# Patient Record
Sex: Male | Born: 1975 | Race: White | Hispanic: No | Marital: Single | Smoking: Never smoker
Health system: Southern US, Community
[De-identification: ages and names within clinical notes are randomized; demographics above are authoritative.]

---

## 2014-01-04 ENCOUNTER — Encounter (HOSPITAL_COMMUNITY): Payer: Self-pay | Admitting: Emergency Medicine

## 2014-01-04 ENCOUNTER — Emergency Department (HOSPITAL_COMMUNITY): Payer: Self-pay

## 2014-01-04 ENCOUNTER — Emergency Department (HOSPITAL_COMMUNITY)
Admission: EM | Admit: 2014-01-04 | Discharge: 2014-01-04 | Disposition: A | Discharge status: Home / Self Care | Payer: Self-pay | Attending: Emergency Medicine | Admitting: Emergency Medicine

## 2014-01-04 DIAGNOSIS — S71132A Puncture wound without foreign body, left thigh, initial encounter: Secondary | ICD-10-CM

## 2014-01-04 DIAGNOSIS — W3409XA Accidental discharge from other specified firearms, initial encounter: Secondary | ICD-10-CM | POA: Insufficient documentation

## 2014-01-04 DIAGNOSIS — Y9389 Activity, other specified: Secondary | ICD-10-CM | POA: Insufficient documentation

## 2014-01-04 DIAGNOSIS — W3400XA Accidental discharge from unspecified firearms or gun, initial encounter: Secondary | ICD-10-CM

## 2014-01-04 DIAGNOSIS — Y9289 Other specified places as the place of occurrence of the external cause: Secondary | ICD-10-CM | POA: Insufficient documentation

## 2014-01-04 DIAGNOSIS — S71102A Unspecified open wound, left thigh, initial encounter: Secondary | ICD-10-CM | POA: Insufficient documentation

## 2014-01-04 LAB — COMPREHENSIVE METABOLIC PANEL
ALBUMIN: 4.1 g/dL (ref 3.5–5.2)
ALT: 28 U/L (ref 0–53)
AST: 34 U/L (ref 0–37)
Alkaline Phosphatase: 79 U/L (ref 39–117)
Anion gap: 14 (ref 5–15)
BILIRUBIN TOTAL: 0.2 mg/dL — AB (ref 0.3–1.2)
BUN: 15 mg/dL (ref 6–23)
CHLORIDE: 105 meq/L (ref 96–112)
CO2: 26 mEq/L (ref 19–32)
CREATININE: 1.07 mg/dL (ref 0.50–1.35)
Calcium: 8.8 mg/dL (ref 8.4–10.5)
GFR calc Af Amer: >90 mL/min (ref >90)
GFR calc non Af Amer: 86 mL/min — ABNORMAL LOW (ref >90)
Glucose, Bld: 108 mg/dL — ABNORMAL HIGH (ref 70–99)
POTASSIUM: 3.8 meq/L (ref 3.7–5.3)
Sodium: 145 mEq/L (ref 137–147)
TOTAL PROTEIN: 7.7 g/dL (ref 6.0–8.3)

## 2014-01-04 LAB — PROTIME-INR
INR: 1.11 (ref 0.00–1.49)
PROTHROMBIN TIME: 14.5 s (ref 11.6–15.2)

## 2014-01-04 LAB — PREPARE FRESH FROZEN PLASMA
Unit division: 0
Unit division: 0

## 2014-01-04 LAB — CBC
HEMATOCRIT: 42.4 % (ref 39.0–52.0)
HEMOGLOBIN: 15.1 g/dL (ref 13.0–17.0)
MCH: 31.3 pg (ref 26.0–34.0)
MCHC: 35.6 g/dL (ref 30.0–36.0)
MCV: 88 fL (ref 78.0–100.0)
Platelets: 303 10*3/uL (ref 150–400)
RBC: 4.82 MIL/uL (ref 4.22–5.81)
RDW: 13 % (ref 11.5–15.5)
WBC: 6.2 10*3/uL (ref 4.0–10.5)

## 2014-01-04 LAB — ETHANOL: Alcohol, Ethyl (B): 273 mg/dL — ABNORMAL HIGH (ref 0–11)

## 2014-01-04 LAB — SAMPLE TO BLOOD BANK

## 2014-01-04 LAB — CDS SEROLOGY

## 2014-01-04 MED ORDER — ACETAMINOPHEN 325 MG PO TABS
650.0000 mg | ORAL_TABLET | Freq: Once | ORAL | Status: AC
  Administered 2014-01-04: 650 mg via ORAL
  Filled 2014-01-04: qty 2

## 2014-01-04 MED ORDER — LIDOCAINE HCL (PF) 1 % IJ SOLN
5.0000 mL | Freq: Once | INTRAMUSCULAR | Status: AC
  Administered 2014-01-04: 5 mL via INTRADERMAL
  Filled 2014-01-04: qty 5

## 2014-01-04 NOTE — ED Notes (Signed)
Patient states he does not want any pain medication.
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
## 2014-01-04 NOTE — Discharge Instructions (Signed)
Keep wound clean, shower as normal. Have stitches removed in 7-10 days. See physician for signs of infection such as fevers, spreading redness, pus draining.  If you were given medicines take as directed.  If you are on coumadin or contraceptives realize their levels and effectiveness is altered by many different medicines.  If you have any reaction (rash, tongues swelling, other) to the medicines stop taking and see a physician.   Please follow up as directed and return to the ER or see a physician for new or worsening symptoms.  Thank you. Filed Vitals:   01/04/14 0516 01/04/14 0518 01/04/14 0554 01/04/14 0613  BP: 129/65 123/65    Pulse:   68   Temp:    98.2 F (36.8 C)  TempSrc:    Oral  Resp:   15   SpO2:   97%

## 2014-01-04 NOTE — ED Notes (Signed)
Dr Zavitz at bedside  

## 2014-01-04 NOTE — ED Notes (Signed)
Removed  IV Catheter.
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
## 2018-12-10 ENCOUNTER — Emergency Department (HOSPITAL_COMMUNITY)
Admission: EM | Admit: 2018-12-10 | Discharge: 2018-12-10 | Disposition: A | Discharge status: Home / Self Care | Payer: 59 | Attending: Emergency Medicine | Admitting: Emergency Medicine

## 2018-12-10 ENCOUNTER — Encounter (HOSPITAL_COMMUNITY): Payer: Self-pay | Admitting: Emergency Medicine

## 2018-12-10 ENCOUNTER — Other Ambulatory Visit: Payer: Self-pay

## 2018-12-10 ENCOUNTER — Emergency Department (HOSPITAL_COMMUNITY): Payer: 59

## 2018-12-10 DIAGNOSIS — F17220 Nicotine dependence, chewing tobacco, uncomplicated: Secondary | ICD-10-CM | POA: Insufficient documentation

## 2018-12-10 DIAGNOSIS — U071 COVID-19: Secondary | ICD-10-CM | POA: Insufficient documentation

## 2018-12-10 DIAGNOSIS — Z5329 Procedure and treatment not carried out because of patient's decision for other reasons: Secondary | ICD-10-CM | POA: Insufficient documentation

## 2018-12-10 DIAGNOSIS — R51 Headache: Secondary | ICD-10-CM | POA: Diagnosis present

## 2018-12-10 DIAGNOSIS — R0602 Shortness of breath: Secondary | ICD-10-CM | POA: Insufficient documentation

## 2018-12-10 LAB — CBC WITH DIFFERENTIAL/PLATELET
Abs Immature Granulocytes: 0.04 10*3/uL (ref 0.00–0.07)
Basophils Absolute: 0 10*3/uL (ref 0.0–0.1)
Basophils Relative: 0 %
Eosinophils Absolute: 0 10*3/uL (ref 0.0–0.5)
Eosinophils Relative: 0 %
HCT: 43.8 % (ref 39.0–52.0)
Hemoglobin: 15.1 g/dL (ref 13.0–17.0)
Immature Granulocytes: 1 %
Lymphocytes Relative: 9 %
Lymphs Abs: 0.8 10*3/uL (ref 0.7–4.0)
MCH: 31.3 pg (ref 26.0–34.0)
MCHC: 34.5 g/dL (ref 30.0–36.0)
MCV: 90.7 fL (ref 80.0–100.0)
Monocytes Absolute: 0.2 10*3/uL (ref 0.1–1.0)
Monocytes Relative: 2 %
Neutro Abs: 7.6 10*3/uL (ref 1.7–7.7)
Neutrophils Relative %: 88 %
Platelets: 183 10*3/uL (ref 150–400)
RBC: 4.83 MIL/uL (ref 4.22–5.81)
RDW: 12.2 % (ref 11.5–15.5)
WBC: 8.6 10*3/uL (ref 4.0–10.5)
nRBC: 0 % (ref 0.0–0.2)

## 2018-12-10 LAB — COMPREHENSIVE METABOLIC PANEL
ALT: 35 U/L (ref 0–44)
AST: 36 U/L (ref 15–41)
Albumin: 3.8 g/dL (ref 3.5–5.0)
Alkaline Phosphatase: 59 U/L (ref 38–126)
Anion gap: 11 (ref 5–15)
BUN: 13 mg/dL (ref 6–20)
CO2: 26 mmol/L (ref 22–32)
Calcium: 8.5 mg/dL — ABNORMAL LOW (ref 8.9–10.3)
Chloride: 95 mmol/L — ABNORMAL LOW (ref 98–111)
Creatinine, Ser: 1.23 mg/dL (ref 0.61–1.24)
GFR calc Af Amer: >60 mL/min (ref >60)
GFR calc non Af Amer: >60 mL/min (ref >60)
Glucose, Bld: 122 mg/dL — ABNORMAL HIGH (ref 70–99)
Potassium: 3.8 mmol/L (ref 3.5–5.1)
Sodium: 132 mmol/L — ABNORMAL LOW (ref 135–145)
Total Bilirubin: 0.8 mg/dL (ref 0.3–1.2)
Total Protein: 8.1 g/dL (ref 6.5–8.1)

## 2018-12-10 LAB — SARS CORONAVIRUS 2 BY RT PCR (HOSPITAL ORDER, PERFORMED IN ~~LOC~~ HOSPITAL LAB): SARS Coronavirus 2: POSITIVE — AB

## 2018-12-10 LAB — PROTIME-INR
INR: 1.1 (ref 0.8–1.2)
Prothrombin Time: 14.1 seconds (ref 11.4–15.2)

## 2018-12-10 LAB — LACTIC ACID, PLASMA: Lactic Acid, Venous: 1.1 mmol/L (ref 0.5–1.9)

## 2018-12-10 MED ORDER — PROCHLORPERAZINE EDISYLATE 10 MG/2ML IJ SOLN
10.0000 mg | Freq: Once | INTRAMUSCULAR | Status: AC
  Administered 2018-12-10: 10 mg via INTRAVENOUS
  Filled 2018-12-10: qty 2

## 2018-12-10 MED ORDER — DEXAMETHASONE SODIUM PHOSPHATE 10 MG/ML IJ SOLN
10.0000 mg | Freq: Once | INTRAMUSCULAR | Status: AC
  Administered 2018-12-10: 10 mg via INTRAVENOUS
  Filled 2018-12-10: qty 1

## 2018-12-10 MED ORDER — DIPHENHYDRAMINE HCL 50 MG/ML IJ SOLN
12.5000 mg | Freq: Once | INTRAMUSCULAR | Status: AC
  Administered 2018-12-10: 12.5 mg via INTRAVENOUS
  Filled 2018-12-10: qty 1

## 2018-12-10 MED ORDER — ACETAMINOPHEN 500 MG PO TABS
1000.0000 mg | ORAL_TABLET | Freq: Once | ORAL | Status: AC
  Administered 2018-12-10: 1000 mg via ORAL
  Filled 2018-12-10: qty 2

## 2018-12-10 MED ORDER — SODIUM CHLORIDE 0.9 % IV BOLUS
500.0000 mL | Freq: Once | INTRAVENOUS | Status: AC
  Administered 2018-12-10: 500 mL via INTRAVENOUS

## 2018-12-10 NOTE — ED Triage Notes (Signed)
Pt reports that since Thursday had headache and had fevers. Took ibuprofen around 8am today.

## 2018-12-10 NOTE — Discharge Instructions (Addendum)
You were seen in the emergency department today for headache, fever, and body aches. Your COVID-19 testing was positive.  We recommended you be admitted to the hospital due to your symptoms and your respiratory status.  You are leaving [place].  Please be aware you may return to the emergency department for further care at any time.  Your chest x-ray showed findings consistent with COVID, your heart also look somewhat enlarged, this should be followed up by primary care for repeat x-ray possibly.  Please take Tylenol/Motrin per over-the-counter dosing to help with fever/chills/pain. Please be sure to stay well-hydrated.  Please follow attached guidelines. Be sure to wash your hands and quarantine.   Follow-up with primary care within 3 days for reevaluation, please call him to make you aware of your symptoms so that they can maintain safety given your diagnosis of COVID.  If you do not primary care please call the phone number circled in your discharge instructions.  You will need to quarantine for 14 days. Return to the emergency department again anytime should you choose to, please return immediately if you experience worsening trouble breathing, worsening headache, change in quality of headache, passing out, inability to keep fluids down, or any other concerns.

## 2018-12-10 NOTE — ED Notes (Signed)
PT ambulated around room. Pulse ox  90-93% on room air. Pt expressed shob was increased from resting position.

## 2018-12-10 NOTE — ED Notes (Addendum)
CRITICAL VALUE STICKER  CRITICAL VALUE: COVID POSITIVE  DATE & TIME NOTIFIED: 12/10/18 1802  MD NOTIFIED: Roslynn Amble MD  TIME OF NOTIFICATION: [phone]

## 2018-12-10 NOTE — ED Provider Notes (Signed)
[facility]-EMERGENCY DEPT Provider Note   CSN: [id number] Arrival date & time: 12/10/18  1514     History   Chief Complaint Chief Complaint  Patient presents with   Headache   Fever    HPI Charles Roach is a 43 y.o. male without significant past medical history who presents to the emergency department with complaints of headache and fever for the past 4 days.  Patient states headache is located to the left side of his head, and has had gradual onset with steady progression and has been waxing/waning. States pain feels somewhat like a brain freeze at times. Pain is worse & he has some blurry vision/dizziness when fever heightens, otherwise no specific alleviating/aggravating factors. Taking ibuprofen at home without much change- last dose this AM. He has also had some nasal congestion, generalized body aches, dry cough, and mild dyspnea at times. His wife had some nasal congestion, but no significant sick contacts w/ similar sxs. Works for [employer], no known COVID exposures that he is aware of. Reports he has not necessarily had a similar headache. Denies diplopia, numbness, weakness, head injury, syncope, neck stiffness, vomiting, chest pain, or abdominal pain. Had all all childhood immunizations.     HPI  History reviewed. No pertinent past medical history.  There are no active problems to display for this patient.   History reviewed. No pertinent surgical history.      Home Medications    Prior to Admission medications   Not on File    Family History No family history on file.  Social History Social History   Tobacco Use   Smoking status: Never Smoker   Smokeless tobacco: Current User    Types: Snuff  Substance Use Topics   Alcohol use: Yes    Comment: socially   Drug use: No     Allergies   Patient has no known allergies.   Review of Systems Review of Systems  Constitutional: Positive for chills and fever.  HENT: Positive for  congestion. Negative for sore throat.   Eyes: Positive for visual disturbance.  Respiratory: Positive for cough and shortness of breath.   Cardiovascular: Negative for chest pain and leg swelling.  Gastrointestinal: Positive for nausea. Negative for abdominal pain, blood in stool, constipation, diarrhea and vomiting.  Genitourinary: Negative for dysuria.  Musculoskeletal: Positive for myalgias.  Neurological: Positive for headaches. Negative for tremors, seizures, syncope, speech difficulty, weakness and numbness.  All other systems reviewed and are negative.    Physical Exam Updated Vital Signs BP 122/71    Pulse 92    Temp (!) 103.3 F (39.6 C) (Oral)    Resp (!) 26    Ht 5\' 7"  (1.702 m)    Wt 74.8 kg    SpO2 92%    BMI 25.84 kg/m   Physical Exam Vitals signs and nursing note reviewed.  Constitutional:      General: He is not in acute distress.    Appearance: He is well-developed. He is not toxic-appearing.  HENT:     Head: Normocephalic and atraumatic.     Comments: No tenderness over temporal artery region bilaterally.    Right Ear: Tympanic membrane is not perforated, erythematous, retracted or bulging.     Left Ear: Tympanic membrane is not perforated, erythematous, retracted or bulging.     Nose: Congestion present.     Mouth/Throat:     Pharynx: Oropharynx is clear. Uvula midline.  Eyes:     General:  Right eye: No discharge.        Left eye: No discharge.     Extraocular Movements: Extraocular movements intact.     Conjunctiva/sclera: Conjunctivae normal.     Pupils: Pupils are equal, round, and reactive to light.     Comments: No proptosis.  Neck:     Musculoskeletal: Normal range of motion and neck supple. No neck rigidity.  Cardiovascular:     Rate and Rhythm: Regular rhythm. Tachycardia present.  Pulmonary:     Effort: Tachypnea (mild) present. No respiratory distress.     Breath sounds: Rhonchi (scattered) present. No wheezing or rales.     Comments:  Patient initially on 2L via [place], turned off, SpO2 92-94% on RA.  Abdominal:     General: There is no distension.     Palpations: Abdomen is soft.     Tenderness: There is no abdominal tenderness.  Lymphadenopathy:     Cervical: No cervical adenopathy.  Skin:    General: Skin is warm and dry.     Findings: No rash.  Neurological:     Mental Status: He is alert.     Comments: Alert. Clear speech. No facial droop. CNIII-XII grossly intact. Bilateral upper and lower extremities' sensation grossly intact. 5/5 symmetric strength with grip strength and with plantar and dorsi flexion bilaterally.Normal finger to nose bilaterally. Negative pronator drift.    Psychiatric:        Behavior: Behavior normal.    ED Treatments / Results  Labs (all labs ordered are listed, but only abnormal results are displayed) Labs Reviewed  COMPREHENSIVE METABOLIC PANEL - Abnormal; Notable for the following components:      Result Value   Sodium 132 (*)    Chloride 95 (*)    Glucose, Bld 122 (*)    Calcium 8.5 (*)    All other components within normal limits  CULTURE, BLOOD (ROUTINE X 2)  CULTURE, BLOOD (ROUTINE X 2)  SARS CORONAVIRUS 2 (HOSPITAL ORDER, PERFORMED IN [facility])  LACTIC ACID, PLASMA  CBC WITH DIFFERENTIAL/PLATELET  PROTIME-INR  URINALYSIS, ROUTINE W REFLEX MICROSCOPIC   EKG EKG Interpretation  Date/Time:  Tuesday December 10 2018 15:31:21 EDT Ventricular Rate:  90 PR Interval:    QRS Duration: 87 QT Interval:  345 QTC Calculation: 423 R Axis:   57 Text Interpretation:  Sinus rhythm Low voltage, extremity leads Confirmed by Marianna Fuss ([id number]) on 12/10/2018 4:17:04 PM   Radiology Dg Chest Port 1 View  Result Date: 12/10/2018 CLINICAL DATA:  Fever and headache for 2 days EXAM: PORTABLE CHEST 1 VIEW COMPARISON:  None. FINDINGS: Low volume AP portable examination with multifocal bilateral heterogeneous airspace opacity, most conspicuous at the periphery of the  right midlung and lung base. Cardiomegaly. Osseous structures are unremarkable. IMPRESSION: 1. Low volume AP portable examination with multifocal bilateral heterogeneous airspace opacity, most conspicuous at the periphery of the right midlung and lung base, consistent with multifocal infection. 2.  Cardiomegaly. Electronically Signed   By: Lauralyn Primes M.D.   On: 12/10/2018 16:54    Procedures Procedures (including critical care time)  Medications Ordered in ED Medications  acetaminophen (TYLENOL) tablet 1,000 mg (1,000 mg Oral Given 12/10/18 1610)  prochlorperazine (COMPAZINE) injection 10 mg (10 mg Intravenous Given 12/10/18 1627)  diphenhydrAMINE (BENADRYL) injection 12.5 mg (12.5 mg Intravenous Given 12/10/18 1627)  sodium chloride 0.9 % bolus 500 mL (0 mLs Intravenous Stopped 12/10/18 1646)     Initial Impression / Assessment and Plan / ED  Course  I have reviewed the triage vital signs and the nursing notes.  Pertinent labs & imaging results that were available during my care of the patient were reviewed by me and considered in my medical decision making (see chart for details).   Patient presents to the emergency department with complaints of fever and headaches as well as some generalized body aches, cough, & dyspnea. Nontoxic appearing, vitals notable for fever, mild tachycardia improved, and borderline O2- 92-94% @ rest on room air. Regarding headache- gradual onset steady progression, no head injury, not hypertensive- low suspicion for bleed. No focal neurologic deficits doubt ischemic CVA.  No temporal artery tenderness, vision grossly intact, examination with his age feel that temporal arteritis is less likely.  Patient has no nuchal rigidity, good range of motion of the neck, even with fever meningitis seems less likely.  Lungs are rhonchorous intermittently throughout, no wheezing or rails, he is mildly tachypneic with borderline oxygen saturation.  We will proceed with labs to include  blood cultures, rapid COVID test, and portable chest x-ray.  Primary suspicion is for COVID 19 given his presentation, therefore will hold off on aggressive fluid resuscitation/antibiotics currently pending work-up, will trial migraine cocktail and small fluid bolus.  EKG: Sinus rhythm.  CBC: No leukocytosis or anemia CMP: Mild electrolyte abnormalities with hyponatremia at 132, hypochloremia at 95, and hypocalcemia at 8.5.  Renal function preserved, LFTs WNL. PT/INR: WNL Lactic acid: WNL CXR: 1. Low volume AP portable examination with multifocal bilateral heterogeneous airspace opacity, most conspicuous at the periphery of the right midlung and lung base, consistent with multifocal infection. 2.  Cardiomegaly.  Patient feeling improved status post migraine cocktail.  Remains with no focal neurologic deficits. COVID 19 testing: Positive.  Suspect patient's variety of symptoms are related to COVID-19 positive status. Ambulatory SPO2 90 to 93% with increased work of breathing with multifocal airspace opacities and cardiomegaly feel patient warrants admission.  Both myself and supervising physician Dr. Roslynn Amble discussed recommendation for admission, patient refused, ultimately signing out AMA.   Discussion w/ patient regarding the nature and purpose, risks and benefits, as well as, the alternatives of treatment. Time was given to allow the opportunity to ask questions and consider options. After the discussion, the patient decided to refuse admission. The patient was informed that refusal could lead to, but was not limited to, death, permanent disability, or severe pain.. Prior to refusing, I determined that the patient had the capacity to make their decision and understood the consequences of that decision. After refusal, I made every reasonable effort to treat them to the best of my ability.  The patient was notified that they may return to the emergency department at any time for further treatment.      Discussed need for quarantine, hand hygiene and antipyretics.   This is a shared visit with supervising physician Dr. Roslynn Amble who has independently evaluated patient & provided guidance in evaluation/management/disposition, in agreement with care   Charles Roach was evaluated in Emergency Department on 12/10/2018 for the symptoms described in the history of present illness. He/she was evaluated in the context of the global COVID-19 pandemic, which necessitated consideration that the patient might be at risk for infection with the SARS-CoV-2 virus that causes COVID-19. Institutional protocols and algorithms that pertain to the evaluation of patients at risk for COVID-19 are in a state of rapid change based on information released by regulatory bodies including the CDC and federal and state organizations. These policies and algorithms were followed  during the patient's care in the ED.  Vitals:   12/10/18 1746 12/10/18 1830  BP: 132/65 131/62  Pulse: 91 80  Resp: (!) 26 18  Temp:    SpO2: 91% 93%     Final Clinical Impressions(s) / ED Diagnoses   Final diagnoses:  COVID-19    ED Discharge Orders    None       Desmond Lopeetrucelli, Doniesha Landau R, PA-C 12/10/18 1848    Milagros Lollykstra, Richard S, MD 12/11/18 0103    Milagros Lollykstra, Richard S, MD 12/11/18 [phone]

## 2018-12-15 LAB — CULTURE, BLOOD (ROUTINE X 2)
Culture: NO GROWTH
Culture: NO GROWTH
Special Requests: ADEQUATE
Special Requests: ADEQUATE

## 2021-01-18 IMAGING — DX DG CHEST 1V PORT
1 series · 1 of 1 positions shown · non-contrast
Comparison: None.

CLINICAL DATA: Fever and headache for 2 days

EXAM:
PORTABLE CHEST 1 VIEW

[chest ap]
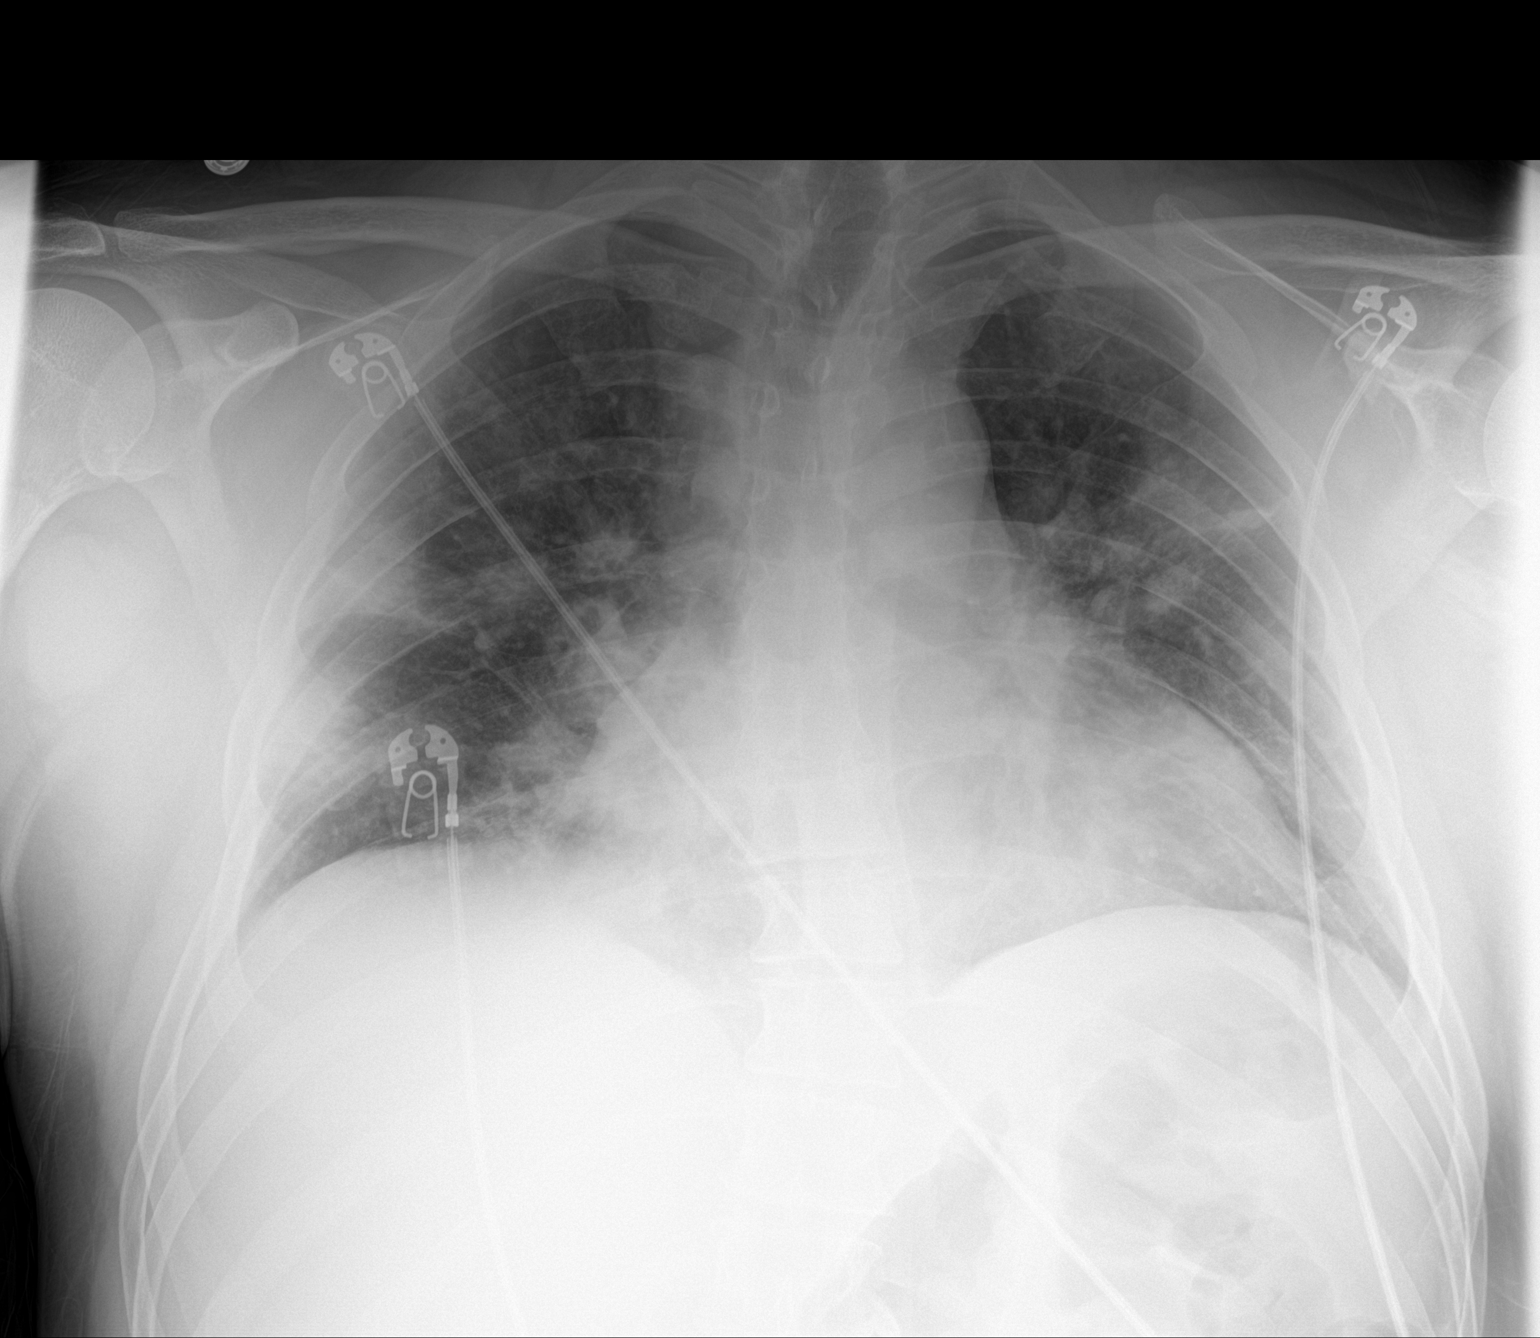

[1 of 1 positions shown; findings below may reference images not displayed]

FINDINGS: Low volume AP portable examination with multifocal bilateral
heterogeneous airspace opacity, most conspicuous at the periphery of
the right midlung and lung base. Cardiomegaly. Osseous structures
are unremarkable.
IMPRESSION: 1. Low volume AP portable examination with multifocal bilateral
heterogeneous airspace opacity, most conspicuous at the periphery of
the right midlung and lung base, consistent with multifocal
infection.

2.  Cardiomegaly.

## 2023-11-05 ENCOUNTER — Other Ambulatory Visit: Payer: Self-pay | Admitting: Family Medicine

## 2023-11-05 DIAGNOSIS — R109 Unspecified abdominal pain: Secondary | ICD-10-CM

## 2023-11-14 ENCOUNTER — Ambulatory Visit
Admission: RE | Admit: 2023-11-14 | Discharge: 2023-11-14 | Disposition: A | Discharge status: Home / Self Care | Source: Ambulatory Visit | Attending: Family Medicine | Admitting: Family Medicine

## 2023-11-14 DIAGNOSIS — R109 Unspecified abdominal pain: Secondary | ICD-10-CM

## 2023-11-14 MED ORDER — IOPAMIDOL (ISOVUE-370) INJECTION 76%
75.0000 mL | Freq: Once | INTRAVENOUS | Status: AC | PRN
  Administered 2023-11-14: 75 mL via INTRAVENOUS
